# Patient Record
Sex: Male | Born: 1960 | Race: White | Hispanic: No
Health system: Southern US, Community
[De-identification: ages and names within clinical notes are randomized; demographics above are authoritative.]

---

## 2012-03-06 ENCOUNTER — Observation Stay: Payer: Self-pay | Admitting: Internal Medicine

## 2012-03-06 LAB — URINALYSIS, COMPLETE
Blood: NEGATIVE
Glucose,UR: NEGATIVE mg/dL (ref 0–75)
Ketone: NEGATIVE
Leukocyte Esterase: NEGATIVE
Nitrite: NEGATIVE
Ph: 6 (ref 4.5–8.0)
Specific Gravity: 1.019 (ref 1.003–1.030)
Squamous Epithelial: NONE SEEN

## 2012-03-06 LAB — COMPREHENSIVE METABOLIC PANEL
Anion Gap: 10 (ref 7–16)
Calcium, Total: 9.3 mg/dL (ref 8.5–10.1)
Chloride: 105 mmol/L (ref 98–107)
Creatinine: 1.32 mg/dL — ABNORMAL HIGH (ref 0.60–1.30)
EGFR (African American): >60
EGFR (Non-African Amer.): >60
Potassium: 3.9 mmol/L (ref 3.5–5.1)
SGOT(AST): 27 U/L (ref 15–37)
SGPT (ALT): 31 U/L (ref 12–78)
Total Protein: 7.9 g/dL (ref 6.4–8.2)

## 2012-03-06 LAB — CBC WITH DIFFERENTIAL/PLATELET
Basophil %: 0.5 %
HGB: 15.3 g/dL (ref 13.0–18.0)
Lymphocyte #: 0.9 10*3/uL — ABNORMAL LOW (ref 1.0–3.6)
MCH: 31.3 pg (ref 26.0–34.0)
MCHC: 34.1 g/dL (ref 32.0–36.0)
MCV: 92 fL (ref 80–100)
Monocyte #: 1.1 x10 3/mm — ABNORMAL HIGH (ref 0.2–1.0)
Monocyte %: 6.1 %
Neutrophil #: 15.9 10*3/uL — ABNORMAL HIGH (ref 1.4–6.5)
Platelet: 248 10*3/uL (ref 150–440)
RDW: 13.4 % (ref 11.5–14.5)

## 2012-03-06 LAB — CK TOTAL AND CKMB (NOT AT ARMC)
CK, Total: 92 U/L (ref 35–232)
CK-MB: <0.5 ng/mL — ABNORMAL LOW (ref 0.5–3.6)

## 2012-03-06 LAB — PROTIME-INR
INR: 1.3
Prothrombin Time: 16.7 s — ABNORMAL HIGH

## 2012-03-06 LAB — TROPONIN I
Troponin-I: 0.02 ng/mL
Troponin-I: 0.02 ng/mL
Troponin-I: 0.03 ng/mL

## 2012-03-06 LAB — RAPID INFLUENZA A&B ANTIGENS

## 2012-03-07 LAB — CREATININE, SERUM: EGFR (Non-African Amer.): >60

## 2012-03-12 LAB — CULTURE, BLOOD (SINGLE)

## 2013-06-17 IMAGING — CR DG CHEST 2V
1 series · 2 of 2 positions shown · non-contrast
Comparison: none

REASON FOR EXAM: syncope
COMMENTS:

[Series 1: pa · 0.17mm/px · 2 of 2 slices shown]
[im 1/2]
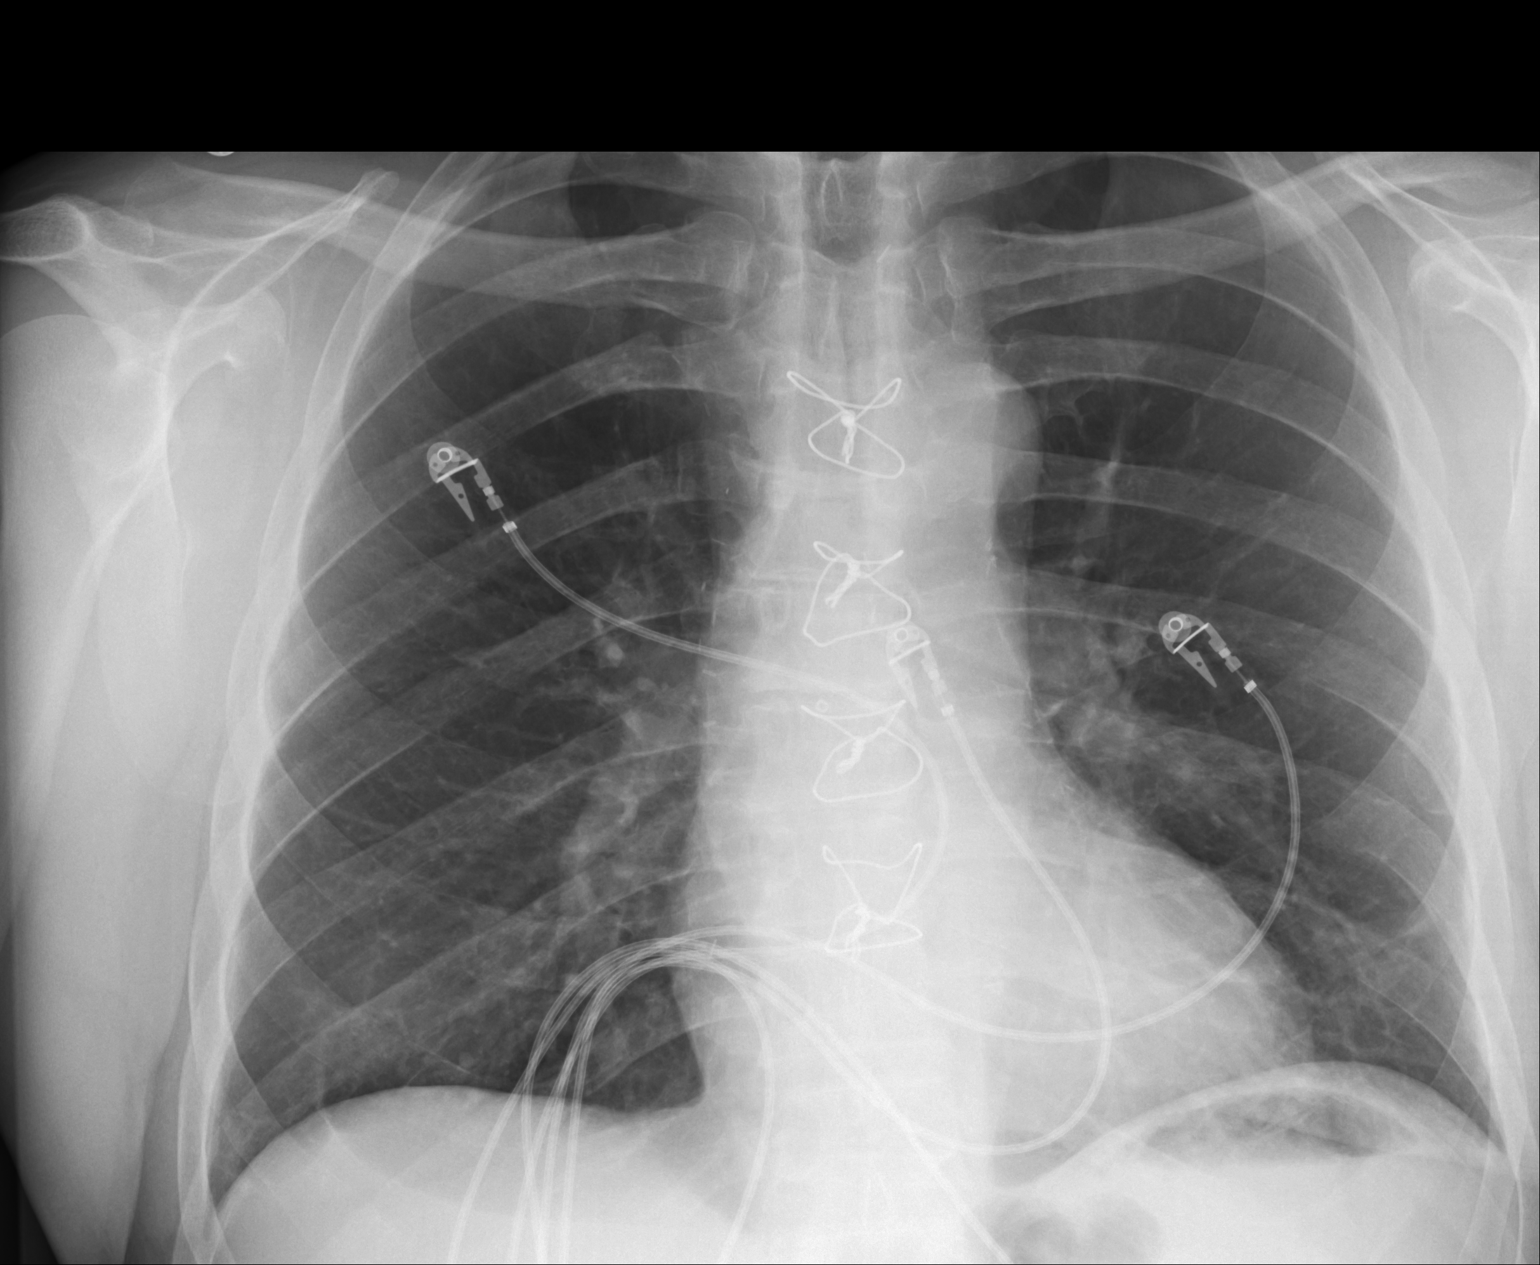
[im 2/2]
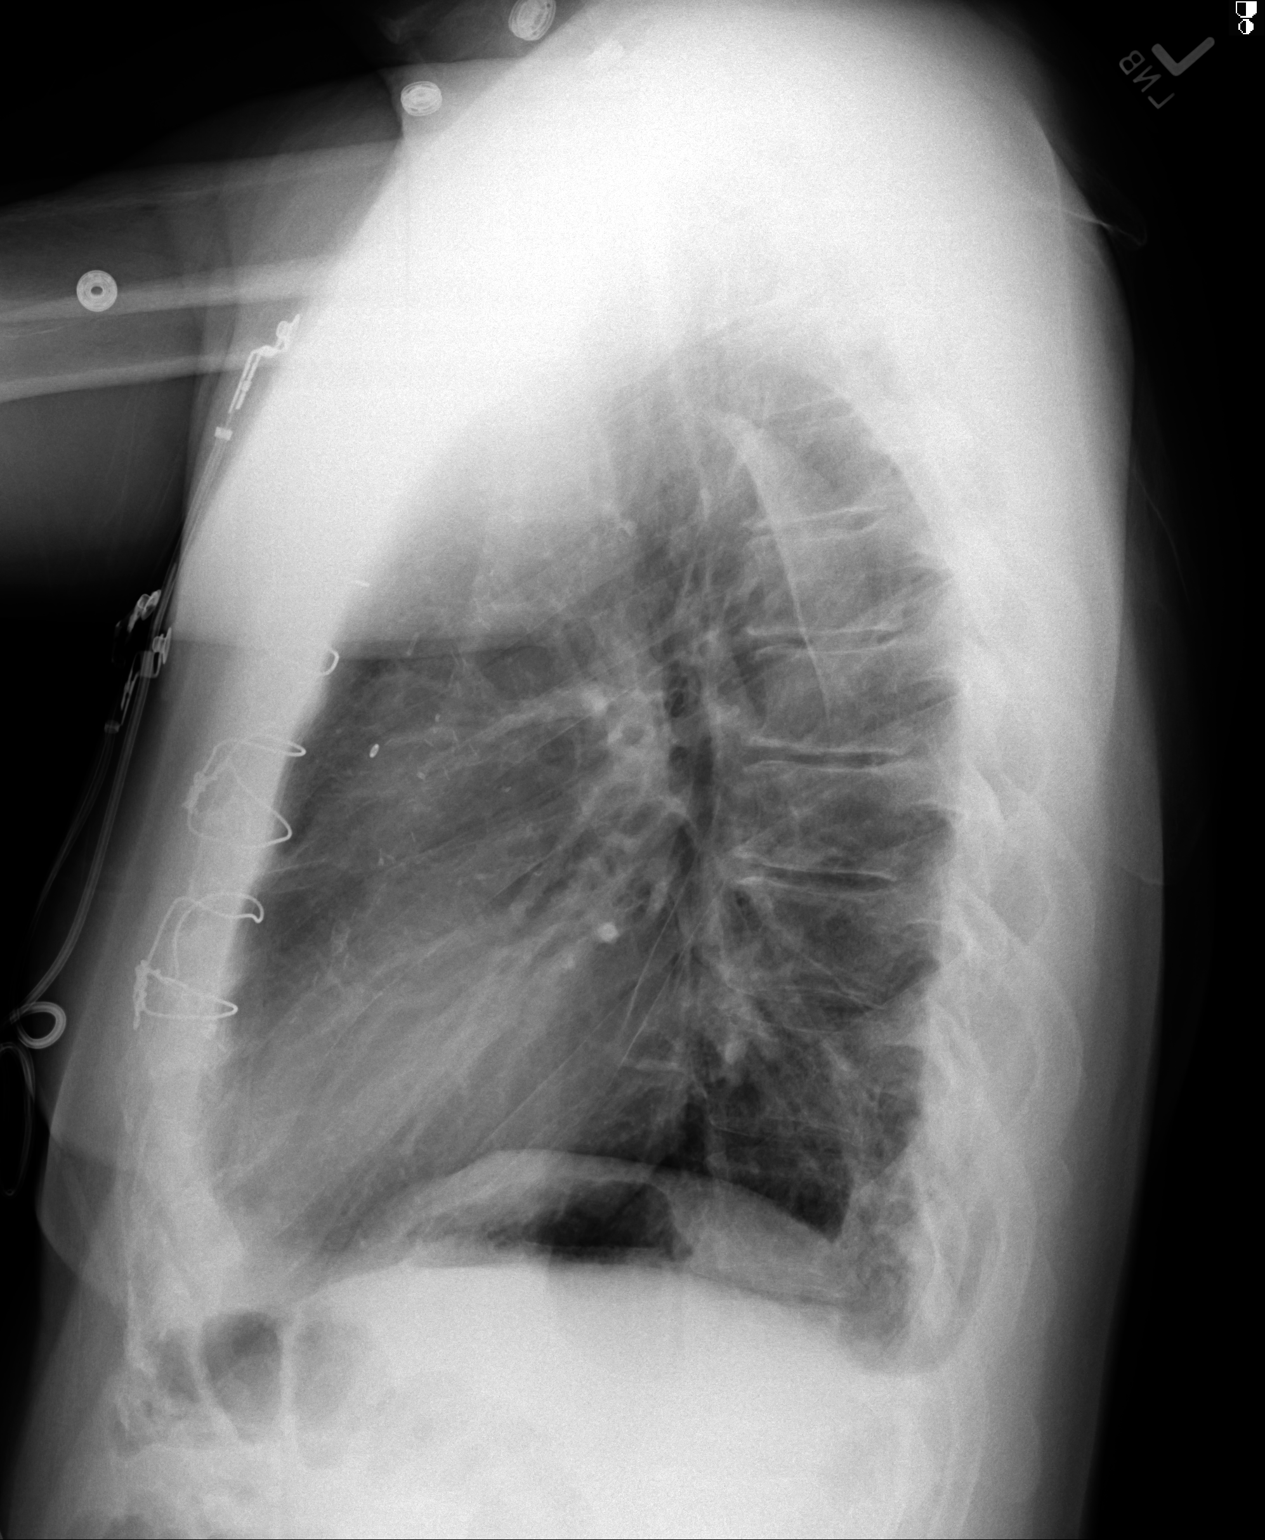

[2 of 2 positions shown; findings below may reference images not displayed]

PROCEDURE:     DXR - DXR CHEST PA (OR AP) AND LATERAL  - March 07, 2012  [DATE]

RESULT:     Comparison made to the study March 06, 2012.

The lungs are well-expanded. There is no focal infiltrate. The cardiac
silhouette is normal in size. The mediastinum is normal in width. There is
no pulmonary vascular congestion. There is a trace of blunting of the
posterior costophrenic angles especially on the right which is of uncertain
etiology. No free subdiaphragmatic gas collections are demonstrated.
IMPRESSION: There is no definite evidence of CHF nor of pneumonia.
There may be a trace of blunting of the posterior costophrenic angle on the
right which is of uncertain significance.

[REDACTED]

## 2014-05-21 NOTE — H&P (Signed)
PATIENT NAME:  Eric [name], Eric [name] MR#:  [id number] DATE OF BIRTH:  07-Feb-1960  DATE OF ADMISSION:  03/06/2012  PRIMARY CARE PHYSICIAN: None local. [facility].      REQUESTING PHYSICIAN:  Bayard Malesandolph Brown, MD   CHIEF COMPLAINT: Nausea, vomiting, diarrhea.   HISTORY OF PRESENT ILLNESS: The patient is a 54 year old male with a known history of coronary artery disease, status post CABG graft x last August, also has atrial fibrillation and flutter. Underwent cardiac ablation last Wednesday, was visiting his son here locally. He got up this morning around 5:00 a.m. to go to the bathroom and when he started having diarrhea, associated with nausea and vomiting x 1. He continued to have nausea, vomiting all morning and decided to come to the Emergency Department,  as he was feeling very dizzy.  While in the ED, he was found to be hypotensive with a blood pressure 83/45 and he is being admitted for further evaluation and management. The patient denies any fever, cough, chest pain, shortness of breath. He does admit having contact with sick people around him within last one week, as his son had stomach bug yesterday. His boss's son at work also had some kind of viral disease about a week ago and his friend's wife also had vomiting and diarrhea, which was within the last couple of days, so he does feel he could have infected himself from one of them.   PAST MEDICAL HISTORY:  Coronary artery disease status post CABG x 3 in August 2013. Atrial flutter/atrial fibrillation status post cardiac ablation last Wednesday.   ALLERGIES: PENICILLIN.   SOCIAL HISTORY: No smoking or alcohol. He is working as an Art gallery managerengineer.   FAMILY HISTORY: Father had heart disease, mother with cancer of the breast and pancreas, sister with colon cancer.   MEDICATIONS AT HOME: 1.  Aspirin 81 mg p.o. daily.  2.  Lasix 20 mg p.o. daily.  3.  Klor-Con 20 mEq 1/2 tablet p.o. daily.  4.  Lisinopril 10 mg p.o. daily.  5.  Metoprolol 50  mg p.o. b.i.[name].  6.  Protonix 40 mg p.o. b.i.[name].  7.  Simvastatin 20 mg p.o. at bedtime.  8.  Tikosyn 250 mcg p.o. b.i.[name].  9.  Xarelto 20 mg p.o. daily.   REVIEW OF SYSTEMS:   CONSTITUTIONAL: No fever. Positive fatigue and weakness.  EYES: No blurred or double vision.  ENT: No tinnitus or ear pain.  RESPIRATORY: No cough, wheezing, hemoptysis.  CARDIOVASCULAR: No chest pain, orthopnea, or edema. History of atrial fibrillation, status post cardiac ablation last Wednesday on Tikosyn and Xarelto.    GASTROINTESTINAL: Positive for nausea, vomiting, diarrhea.  GENITOURINARY: No dysuria or hematuria.  ENDOCRINE: No polyuria or nocturia.  HEMATOLOGY: No anemia or easy bruising.  SKIN: No rash or lesion.  MUSCULOSKELETAL: No arthritis or muscle cramp.  NEUROLOGIC: No tingling or numbness. Positive for weakness and dizziness.  PSYCHIATRIC: No history of anxiety, depression.   PHYSICAL EXAMINATION: VITAL SIGNS: Temperature 98.6, heart rate 108 per minute, respirations 18 per minute, blood pressure 83/45 mmHg in the Emergency Department, he is saturating 98% on room air.  GENERAL: The patient is a 54 year old male lying in the bed comfortably without any acute distress.  EYES: Pupils equal, round, and reactive to light and accommodation. No scleral icterus. Extraocular muscles intact. HENT: Head atraumatic, normocephalic. Oropharynx and nasopharynx clear.  NECK: Supple. No jugular venous distention. No thyroid enlargement or tenderness.  LUNGS: Clear to auscultation bilaterally. No wheezes, rales, rhonchi,  or crepitation.  ABDOMEN: Soft, nontender, nondistended. Bowel sounds present. No organomegaly or mass.  EXTREMITIES: No pedal edema, cyanosis or clubbing.  NEUROLOGIC: Nonfocal examination. Cranial nerves II through XII intact. Muscle strength 5/5. Extremity sensation intact.  PSYCHIATRIC: The patient is oriented to time, person x 3.  SKIN: He has a healing surgical scar in the mid sternum from  recent CABG last August. No other ulcers or skin lesions.   LABORATORY, DIAGNOSTIC, AND RADIOLOGICAL DATA: Normal BMP except creatinine 1.32. Normal liver function test. Normal first set of cardiac enzymes. Normal CBC except white count of 18.1, PT 16.7, INR 1.3.   CT scan of the head in the Emergency Department showed no acute intracranial pathology.   Chest x-ray while in the ED showed no acute cardiopulmonary disease. Possible atelectasis at the right lower lung.   EKG did not show any acute ST-T changes.   IMPRESSION AND PLAN: 1.  Nausea, vomiting, diarrhea, likely viral in nature, as this is acute in onset with him having contacts with sick people within last one week. There is a good likelihood that he is infected with a virus, so we will provide symptomatic management. Start him on clear liquid diet and advance as tolerated. We will obtain stool studies if he does have more diarrhea.  2. Presyncope/syncope, likely due to hypotension and nausea, vomiting.  We will provide aggressive IV fluids and monitor his symptoms.  3.  Hypotension likely due to nausea, vomiting, diarrhea.  His blood  pressure in the Emergency Department was 83/45. We will hold all his blood pressure medication at this time and hydrate with IV fluids. Resume all his blood pressure medication slowly as tolerated.  4.  Acute renal failure, likely prerenal in nature. His creatinine is 1.32. We will hydrate him with IV fluid, avoid any nephrotoxin, hold his lisinopril and Lasix,  recheck in the morning. 5.  History of atrial fibrillation. We will resume Xarelto; also resume Tikosyn once the blood pressure is greater 110 mmHg.  CODE STATUS: Full code.   Total time taking care of this patient: 55 minutes.  ____________________________ Ellamae Sia. Sherryll Burger, MD vss:cc [name]: 03/06/2012 15:21:32 ET T: 03/06/2012 16:03:58 ET JOB#: [id number]  cc: Phoenix Dresser S. Sherryll Burger, MD, <Dictator> Primary Care Physician in [facility]  MD ELECTRONICALLY SIGNED 03/07/2012 17:14

## 2014-05-21 NOTE — Discharge Summary (Signed)
PATIENT NAME:  Eric [name], Tesla D MR#:  [id number] DATE OF BIRTH:  1960/08/04  DATE OF ADMISSION:  03/06/2012 DATE OF DISCHARGE:  03/07/2012  DISCHARGE DIAGNOSES:  1.  Viral gastroenteritis, now resolving fine. No nausea, vomiting or diarrhea. 2.  Presyncope/syncope, likely due to hypertension, now resolved.  3.  Hypotension, likely due to nausea, vomiting and diarrhea, now resolved.  4.  Acute renal failure, likely prerenal, resolved with hydration.   SECONDARY DIAGNOSES:  1.  Coronary artery disease, status post coronary artery bypass graft.  2.  Atrial fibrillation/flutter, status post cardiac ablation.   CONSULTATIONS: None.   PROCEDURES/RADIOLOGY: Chest x-ray on 02/07 showed no evidence of acute cardiopulmonary disease. CT scan of the head without contrast 02/06 showed no acute intracranial abnormality. Chest x-ray on 02/07 showed no acute cardiopulmonary disease.   MAJOR LABORATORY PANEL: Blood cultures x 2 were negative on 02/06. Urinalysis was negative on admission. H1 N1 flu was negative and influenza A and B antigens were also negative.   HISTORY AND SHORT HOSPITAL COURSE: The patient is a 54 year old male with above-mentioned medical problems, who was admitted for nausea, vomiting, and diarrhea, likely thought to be viral gastroenteritis. Please see Dr. Margaretmary EddyShah's dictated history and physical for further details. He was also hypotensive, likely due to dehydration and had acute renal failure, which was also thought to be due to same etiology. He was also feeling very dizzy and presyncopal. The patient was hydrated with IV fluids. He was ruled out for any influenza. He was monitored on telemetry. On 02/07, he was doing much better and was discharged home in stable condition. On the date of discharge, his vital signs were as follows: Temperature 97.7, heart rate 82 per minute, respirations 16 per minute, blood pressure 134/82 mmHg and he was saturating 96% on room air.   PERTINENT PHYSICAL  EXAMINATION ON THE DATE OF DISCHARGE:   CARDIOVASCULAR: S1, S2 normal. No murmurs, rubs, or gallops.  LUNGS: Clear to auscultation bilaterally. No wheezing, rales, rhonchi, or crepitation.  ABDOMEN: Soft and benign.  NEUROLOGIC: Nonfocal examination. All other physical examination remained at baseline.   DISCHARGE MEDICATIONS:  1.  Ecotrin 250 mcg p.o. b.i.d.  2.  Metoprolol 50 mg p.o. b.i.d.  3.  Xarelto 20 mg p.o. daily.  4.  Simvastatin 20 mg p.o. at bedtime.  5.  Protonix 40 mg p.o. b.i.d.  6.  Lisinopril 10 mg p.o. daily. 7.  Aspirin 81 mg p.o. daily.  8.  Lasix 20 mg p.o. daily.  9.  Klor-Con 20 mEq half tablet p.o. daily.   DISCHARGE DIET: Low sodium, low fat, low cholesterol.   DISCHARGE ACTIVITY: As tolerated.   DISCHARGE INSTRUCTIONS AND FOLLOWUP: The patient was instructed to follow up with his primary care physician at [place] in 1 to 2 weeks. He will need followup with cardiology in 2 to 4 weeks.   TOTAL TIME DISCHARGING THIS PATIENT: 45 minutes.   ____________________________ Ellamae SiaVipul S. Sherryll BurgerShah, MD vss:aw D: 03/10/2012 20:58:53 ET T: 03/11/2012 07:54:06 ET JOB#: [id number]  cc: Iliani Vejar S. Sherryll BurgerShah, MD, <Dictator> Primary care physician in [facility] Cardiologist Ellamae [facility]VIPUL [facility] MD ELECTRONICALLY SIGNED 03/11/2012 17:10
